# Patient Record
Sex: Female | Born: 1980 | Race: White | Smoking: Current every day smoker
Health system: Southern US, Community
[De-identification: ages and names within clinical notes are randomized; demographics above are authoritative.]

## PROBLEM LIST (undated history)

## (undated) HISTORY — PX: TUBAL LIGATION: SHX77

---

## 2016-04-08 ENCOUNTER — Emergency Department (HOSPITAL_COMMUNITY)
Admission: EM | Admit: 2016-04-08 | Discharge: 2016-04-08 | Disposition: A | Discharge status: Home / Self Care | Payer: Managed Care, Other (non HMO) | Attending: Emergency Medicine | Admitting: Emergency Medicine

## 2016-04-08 ENCOUNTER — Emergency Department (HOSPITAL_COMMUNITY): Payer: Managed Care, Other (non HMO)

## 2016-04-08 ENCOUNTER — Encounter (HOSPITAL_COMMUNITY): Payer: Self-pay

## 2016-04-08 DIAGNOSIS — X58XXXA Exposure to other specified factors, initial encounter: Secondary | ICD-10-CM | POA: Insufficient documentation

## 2016-04-08 DIAGNOSIS — S39012A Strain of muscle, fascia and tendon of lower back, initial encounter: Secondary | ICD-10-CM

## 2016-04-08 DIAGNOSIS — Y939 Activity, unspecified: Secondary | ICD-10-CM | POA: Insufficient documentation

## 2016-04-08 DIAGNOSIS — Y929 Unspecified place or not applicable: Secondary | ICD-10-CM | POA: Diagnosis not present

## 2016-04-08 DIAGNOSIS — F17210 Nicotine dependence, cigarettes, uncomplicated: Secondary | ICD-10-CM | POA: Insufficient documentation

## 2016-04-08 DIAGNOSIS — Y999 Unspecified external cause status: Secondary | ICD-10-CM | POA: Diagnosis not present

## 2016-04-08 DIAGNOSIS — M545 Low back pain: Secondary | ICD-10-CM | POA: Diagnosis present

## 2016-04-08 LAB — POC URINE PREG, ED: PREG TEST UR: NEGATIVE

## 2016-04-08 MED ORDER — KETOROLAC TROMETHAMINE 10 MG PO TABS
10.0000 mg | ORAL_TABLET | Freq: Once | ORAL | Status: AC
  Administered 2016-04-08: 10 mg via ORAL
  Filled 2016-04-08: qty 1

## 2016-04-08 MED ORDER — DIAZEPAM 5 MG PO TABS
10.0000 mg | ORAL_TABLET | Freq: Once | ORAL | Status: AC
  Administered 2016-04-08: 10 mg via ORAL
  Filled 2016-04-08: qty 2

## 2016-04-08 MED ORDER — DEXAMETHASONE 4 MG PO TABS: 4.0000 mg | ORAL_TABLET | Freq: Two times a day (BID) | ORAL | 0 refills | Status: AC

## 2016-04-08 MED ORDER — DICLOFENAC SODIUM 75 MG PO TBEC: 75.0000 mg | DELAYED_RELEASE_TABLET | Freq: Two times a day (BID) | ORAL | 0 refills | Status: AC

## 2016-04-08 MED ORDER — TRAMADOL HCL 50 MG PO TABS: 50.0000 mg | ORAL_TABLET | Freq: Four times a day (QID) | ORAL | 0 refills | Status: AC | PRN

## 2016-04-08 MED ORDER — TRAMADOL HCL 50 MG PO TABS
100.0000 mg | ORAL_TABLET | Freq: Once | ORAL | Status: AC
  Administered 2016-04-08: 100 mg via ORAL
  Filled 2016-04-08: qty 2

## 2016-04-08 MED ORDER — HYDROCODONE-ACETAMINOPHEN 5-325 MG PO TABS: 2.0000 | ORAL_TABLET | ORAL | 0 refills | Status: AC | PRN

## 2016-04-08 MED ORDER — METHOCARBAMOL 500 MG PO TABS: 500.0000 mg | ORAL_TABLET | Freq: Three times a day (TID) | ORAL | 0 refills | Status: AC

## 2016-04-08 MED ORDER — ONDANSETRON HCL 4 MG PO TABS
4.0000 mg | ORAL_TABLET | Freq: Once | ORAL | Status: AC
  Administered 2016-04-08: 4 mg via ORAL
  Filled 2016-04-08: qty 1

## 2016-04-08 NOTE — ED Triage Notes (Signed)
Lower back pain that radiates down left leg. Denies injury.

## 2016-04-08 NOTE — Discharge Instructions (Signed)
Your neurologic examination is nonacute at this time. The x-ray is negative for fracture, dislocation, or change in disc height. Please apply heating pad to your back, rest your back is much as possible. Please use Robaxin, a closed the neck, Decadron, and Ultram for your pain. Robaxin and Ultram may cause drowsiness. Please do not take these medications and drive, drink alcohol, or operate machinery. Please see Dr. Ophelia Charter for orthopedic evaluation and management if not improving in the next 5-7 days.

## 2016-04-08 NOTE — ED Provider Notes (Signed)
[facility]EM[facility]ENCY DEPT Provider Note   CSN: [id number] Arrival date & time: 04/08/16  1525  First Provider Contact:  None       History   Chief Complaint Chief Complaint  Patient presents with  . Back Pain    HPI Caitlyn Bell is a 35 y.o. female.  Patient is a 35 year old female presents to the emergency department with complaint of back pain.  Patient gives history that for the last 4 or 5 days she's been having pain in the lower back with radiation of the pain into her left leg. She states that when she stands the pain becomes more intense. When she walks she has to walk with a limp because the intensity of the pain. She's not had any injury or trauma to the lower back. His been no recent operations or procedures involving the back. It is of note that approximately 3 years ago she had a similar episode, but this problem resolved on it's own.       History reviewed. No pertinent past medical history.  There are no active problems to display for this patient.   Past Surgical History:  Procedure Laterality Date  . TUBAL LIGATION      OB History    No data available       Home Medications    Prior to Admission medications   Not on File    Family History No family history on file.  Social History Social History  Substance Use Topics  . Smoking status: Current Every Day Smoker    Packs/day: 1.00    Types: Cigarettes  . Smokeless tobacco: Never Used  . Alcohol use No     Allergies   Review of patient's allergies indicates no known allergies.   Review of Systems Review of Systems  Musculoskeletal: Positive for back pain.  All other systems reviewed and are negative.    Physical Exam Updated Vital Signs BP 121/82 (BP Location: Left Arm)   Pulse 94   Temp 98.6 F (37 C) (Oral)   Resp 18   Ht 5\' 2"  (1.575 m)   Wt 81.6 kg   LMP 03/25/2016   SpO2 100%   BMI 32.92 kg/m   Physical Exam  Constitutional: She is oriented to person, place,  and time. She appears well-developed and well-nourished.  Non-toxic appearance.  HENT:  Head: Normocephalic.  Right Ear: Tympanic membrane and external ear normal.  Left Ear: Tympanic membrane and external ear normal.  Eyes: EOM and lids are normal. Pupils are equal, round, and reactive to light.  Neck: Normal range of motion. Neck supple. Carotid bruit is not present.  Cardiovascular: Normal rate, regular rhythm, normal heart sounds, intact distal pulses and normal pulses.   Pulmonary/Chest: Breath sounds normal. No respiratory distress.  Abdominal: Soft. Bowel sounds are normal. There is no tenderness. There is no guarding.  Musculoskeletal:       Lumbar back: She exhibits decreased range of motion, pain and spasm.       Back:  Lymphadenopathy:       Head (right side): No submandibular adenopathy present.       Head (left side): No submandibular adenopathy present.    She has no cervical adenopathy.  Neurological: She is alert and oriented to person, place, and time. She has normal strength. No cranial nerve deficit or sensory deficit.  Skin: Skin is warm and dry.  Psychiatric: She has a normal mood and affect. Her speech is normal.  Nursing note and  vitals reviewed.    ED Treatments / Results  Labs (all labs ordered are listed, but only abnormal results are displayed) Labs Reviewed - No data to display  EKG  EKG Interpretation None       Radiology No results found.  Procedures Procedures (including critical care time)  Medications Ordered in ED Medications - No data to display   Initial Impression / Assessment and Plan / ED Course  I have reviewed the triage vital signs and the nursing notes.  Pertinent labs & imaging results that were available during my care of the patient were reviewed by me and considered in my medical decision making (see chart for details).  Clinical Course    **I have reviewed nursing notes, vital signs, and all appropriate lab and  imaging results for this patient.*  Final Clinical Impressions(s) / ED Diagnoses  Xray of the L spine is neg for fx or dislocation. Xray is negative for disc space changes. Pt to use heating pad to the back. Rx for decadron, diclofenac, and robaxin given to the patient.  Pt to follow up with orthopedic for additional evaluation and management.   Final diagnoses:  Lumbar strain, initial encounter    New Prescriptions New Prescriptions   No medications on file     Ivery Quale, PA-C 04/10/16 2305    Lavera Guise, MD 04/12/16 [phone]

## 2016-04-08 NOTE — ED Notes (Signed)
Patient's tramadol prescription voided and patient given Vicodin 6 pack to go-Per Dr Rondel Baton orders.

## 2016-04-14 MED FILL — Hydrocodone-Acetaminophen Tab 5-325 MG: ORAL | Qty: 6 | Status: AC

## 2017-06-14 IMAGING — DX DG LUMBAR SPINE COMPLETE 4+V
5 series · 5 of 5 positions shown · non-contrast
Comparison: None.

CLINICAL DATA: Low back pain radiating to left hip and leg, no
known injury

EXAM:
LUMBAR SPINE - COMPLETE 4+ VIEW

[l-spine ap]
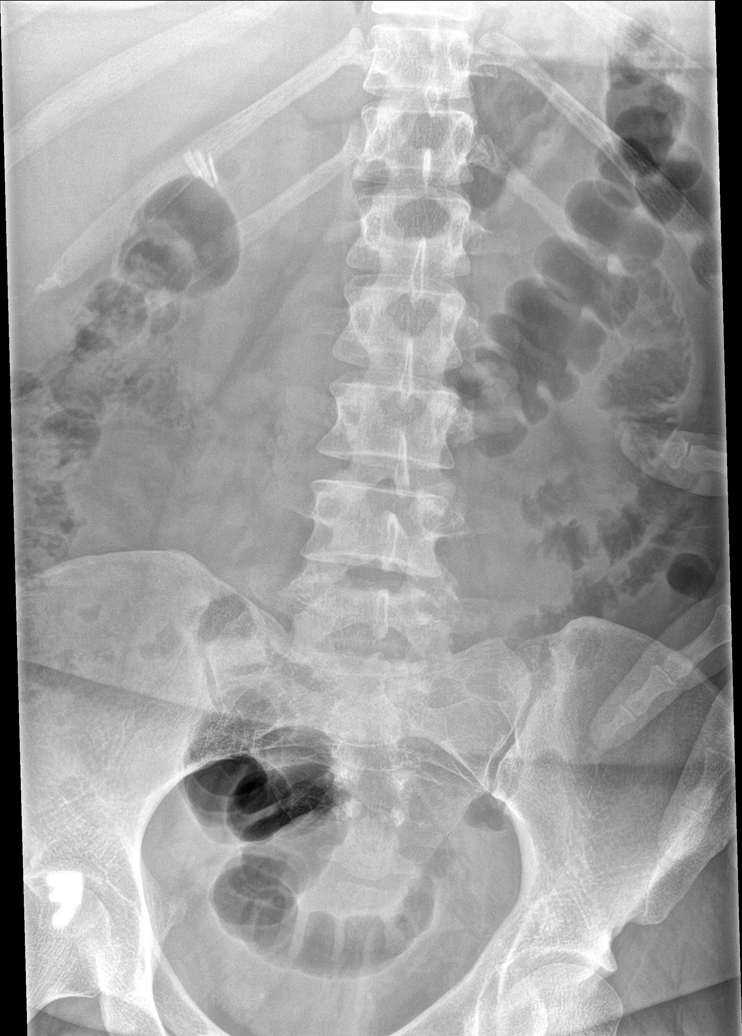

[l-spine obl (1 of 2)]
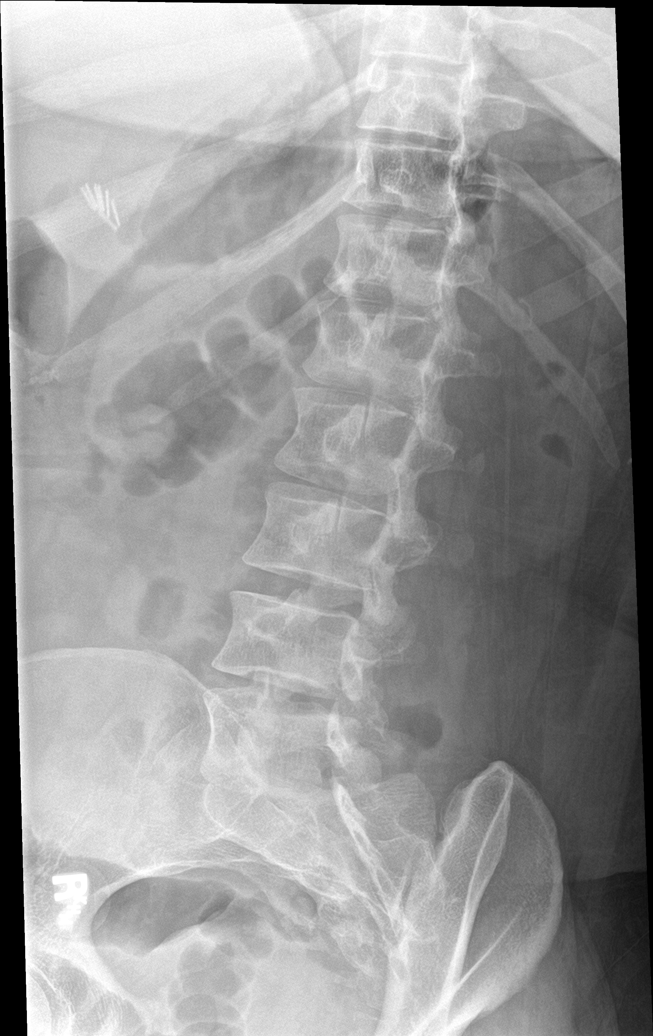

[l-spine obl (2 of 2)]
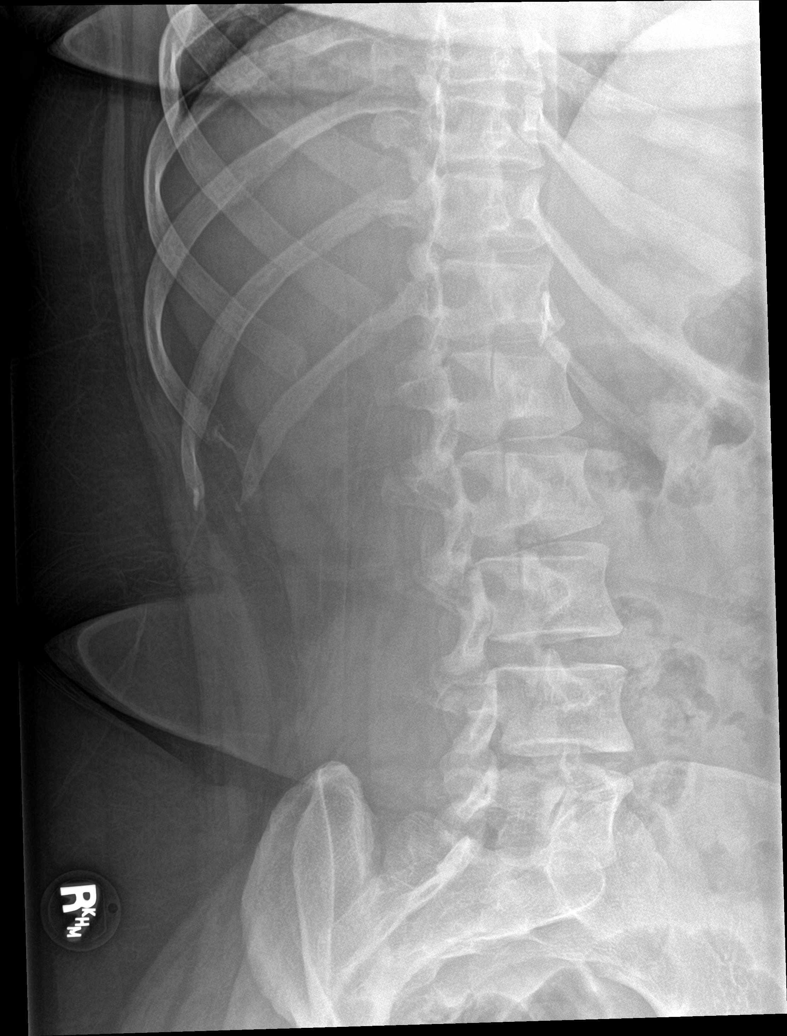

[l-spine lat]
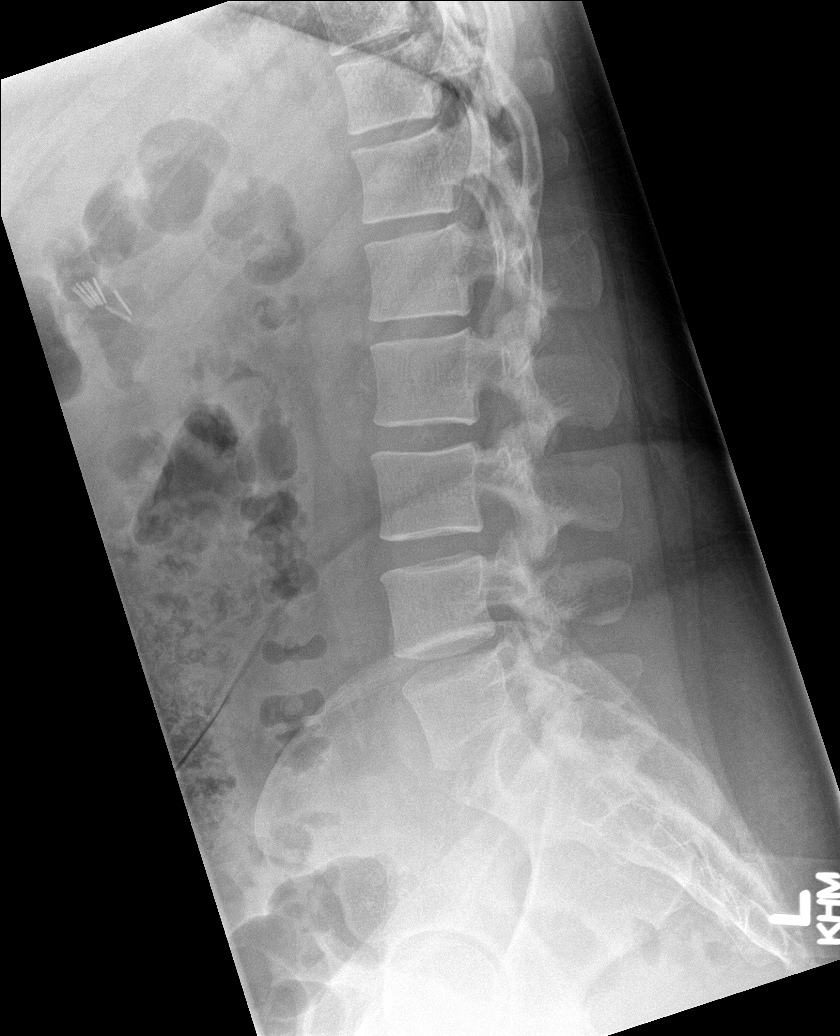

[l-spine spot]
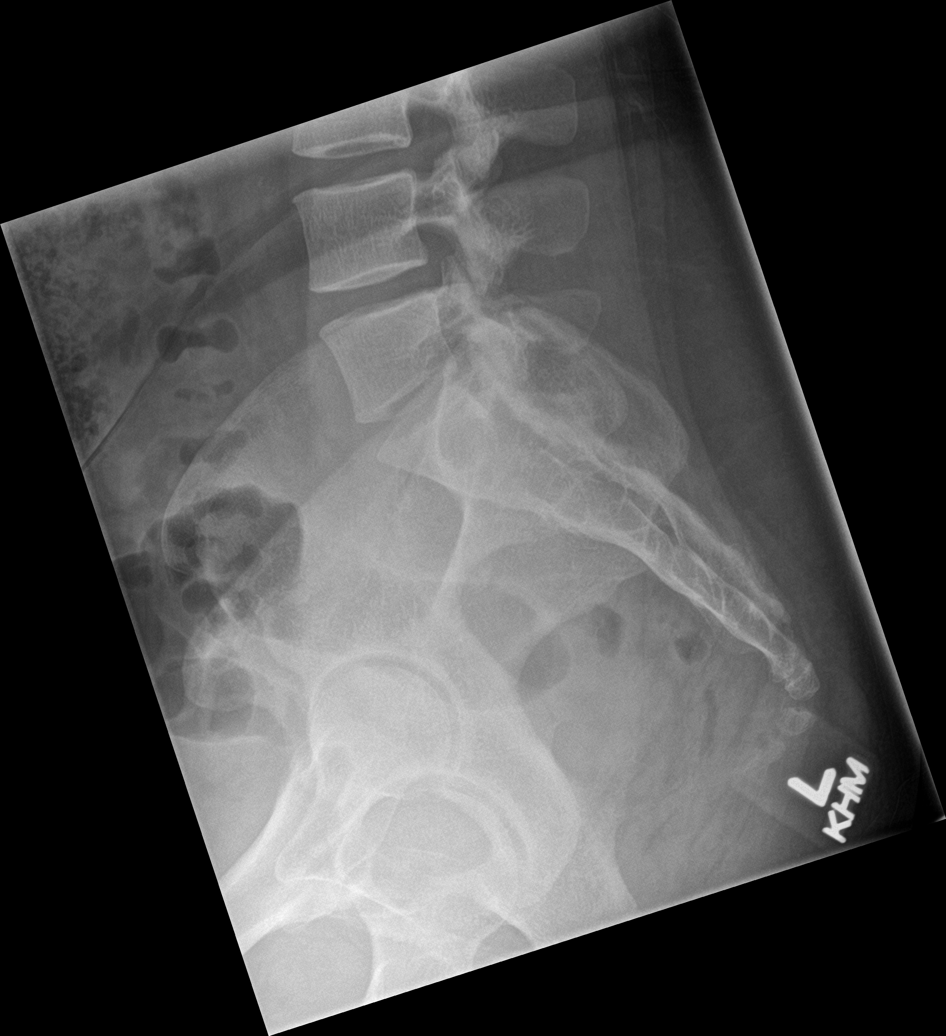

[5 of 5 positions shown; findings below may reference images not displayed]

FINDINGS: Alignment of the lumbar spine is normal. Bone mineralization is
normal. No acute or suspicious osseous finding. No fracture line or
displaced fracture fragment. No degenerative change. No evidence of
pars interarticularis defect. Upper sacrum appears intact and
normally aligned.

Paravertebral soft tissues are unremarkable. Cholecystectomy clips
noted in the right upper quadrant.
IMPRESSION: Negative.
